# Patient Record
Sex: Male | Born: 2005 | Race: Black or African American | Hispanic: No | Marital: Single | Smoking: Never smoker
Health system: Southern US, Community
[De-identification: ages and names within clinical notes are randomized; demographics above are authoritative.]

## PROBLEM LIST (undated history)

## (undated) DIAGNOSIS — J45909 Unspecified asthma, uncomplicated: Secondary | ICD-10-CM

## (undated) HISTORY — PX: MOUTH SURGERY: SHX715

## (undated) HISTORY — DX: Unspecified asthma, uncomplicated: J45.909

## (undated) HISTORY — PX: CIRCUMCISION: SUR203

---
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
## 2024-01-22 DIAGNOSIS — Z419 Encounter for procedure for purposes other than remedying health state, unspecified: Secondary | ICD-10-CM | POA: Diagnosis not present

## 2024-02-19 DIAGNOSIS — Z419 Encounter for procedure for purposes other than remedying health state, unspecified: Secondary | ICD-10-CM | POA: Diagnosis not present

## 2024-03-21 DIAGNOSIS — Z0000 Encounter for general adult medical examination without abnormal findings: Secondary | ICD-10-CM | POA: Diagnosis not present

## 2024-04-01 DIAGNOSIS — Z419 Encounter for procedure for purposes other than remedying health state, unspecified: Secondary | ICD-10-CM | POA: Diagnosis not present

## 2024-05-01 DIAGNOSIS — Z419 Encounter for procedure for purposes other than remedying health state, unspecified: Secondary | ICD-10-CM | POA: Diagnosis not present

## 2024-06-01 DIAGNOSIS — Z419 Encounter for procedure for purposes other than remedying health state, unspecified: Secondary | ICD-10-CM | POA: Diagnosis not present

## 2024-07-01 DIAGNOSIS — Z419 Encounter for procedure for purposes other than remedying health state, unspecified: Secondary | ICD-10-CM | POA: Diagnosis not present

## 2024-08-01 DIAGNOSIS — Z419 Encounter for procedure for purposes other than remedying health state, unspecified: Secondary | ICD-10-CM | POA: Diagnosis not present

## 2024-08-11 DIAGNOSIS — H5213 Myopia, bilateral: Secondary | ICD-10-CM | POA: Diagnosis not present

## 2024-09-01 DIAGNOSIS — Z419 Encounter for procedure for purposes other than remedying health state, unspecified: Secondary | ICD-10-CM | POA: Diagnosis not present

## 2024-11-01 DIAGNOSIS — Z419 Encounter for procedure for purposes other than remedying health state, unspecified: Secondary | ICD-10-CM | POA: Diagnosis not present

## 2024-12-11 ENCOUNTER — Emergency Department (HOSPITAL_COMMUNITY)
Admission: EM | Admit: 2024-12-11 | Discharge: 2024-12-11 | Disposition: A | Discharge status: Home / Self Care | Attending: Emergency Medicine | Admitting: Emergency Medicine

## 2024-12-11 ENCOUNTER — Emergency Department (HOSPITAL_COMMUNITY)

## 2024-12-11 ENCOUNTER — Other Ambulatory Visit: Payer: Self-pay

## 2024-12-11 DIAGNOSIS — S0990XA Unspecified injury of head, initial encounter: Secondary | ICD-10-CM | POA: Diagnosis not present

## 2024-12-11 DIAGNOSIS — S199XXA Unspecified injury of neck, initial encounter: Secondary | ICD-10-CM | POA: Diagnosis not present

## 2024-12-11 DIAGNOSIS — J45909 Unspecified asthma, uncomplicated: Secondary | ICD-10-CM | POA: Insufficient documentation

## 2024-12-11 DIAGNOSIS — S0181XA Laceration without foreign body of other part of head, initial encounter: Secondary | ICD-10-CM | POA: Insufficient documentation

## 2024-12-11 DIAGNOSIS — R102 Pelvic and perineal pain unspecified side: Secondary | ICD-10-CM | POA: Diagnosis not present

## 2024-12-11 DIAGNOSIS — S0993XA Unspecified injury of face, initial encounter: Secondary | ICD-10-CM | POA: Diagnosis not present

## 2024-12-11 DIAGNOSIS — R079 Chest pain, unspecified: Secondary | ICD-10-CM | POA: Diagnosis not present

## 2024-12-11 DIAGNOSIS — Y92410 Unspecified street and highway as the place of occurrence of the external cause: Secondary | ICD-10-CM | POA: Diagnosis not present

## 2024-12-11 NOTE — Discharge Instructions (Signed)
 Your imaging studies did not show any major injuries.  Take ibuprofen  and Tylenol  as needed for headaches and soreness.  The glue on your forehead should wash off over the next couple days.  Return to the emergency department for any new or worsening symptoms of concern.
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
